# Patient Record
Sex: Female | Born: 1960
Health system: Southern US, Community
[De-identification: ages and names within clinical notes are randomized; demographics above are authoritative.]

## PROBLEM LIST (undated history)

## (undated) DIAGNOSIS — J45909 Unspecified asthma, uncomplicated: Secondary | ICD-10-CM

## (undated) DIAGNOSIS — K635 Polyp of colon: Secondary | ICD-10-CM

## (undated) DIAGNOSIS — E119 Type 2 diabetes mellitus without complications: Secondary | ICD-10-CM

## (undated) DIAGNOSIS — Z860100 Personal history of colon polyps, unspecified: Secondary | ICD-10-CM

## (undated) HISTORY — PX: ABDOMINAL HYSTERECTOMY: SHX81

## (undated) HISTORY — DX: Polyp of colon: K63.5

## (undated) HISTORY — PX: ANKLE FRACTURE SURGERY: SHX122

## (undated) HISTORY — DX: Personal history of colon polyps, unspecified: Z86.0100

## (undated) HISTORY — DX: Unspecified asthma, uncomplicated: J45.909

## (undated) HISTORY — DX: Personal history of colonic polyps: Z86.010

## (undated) HISTORY — DX: Type 2 diabetes mellitus without complications: E11.9

## (undated) HISTORY — PX: TUBAL LIGATION: SHX77

---

## 1998-03-11 HISTORY — PX: PARTIAL HYSTERECTOMY: SHX80

## 2015-05-03 LAB — HM COLONOSCOPY

## 2016-02-06 DIAGNOSIS — D649 Anemia, unspecified: Secondary | ICD-10-CM | POA: Insufficient documentation

## 2018-05-11 ENCOUNTER — Ambulatory Visit (INDEPENDENT_AMBULATORY_CARE_PROVIDER_SITE_OTHER): Payer: BC Managed Care – PPO | Admitting: Physician Assistant

## 2018-05-11 ENCOUNTER — Encounter: Payer: Self-pay | Admitting: Physician Assistant

## 2018-05-11 VITALS — BP 125/84 | HR 76 | Temp 98.0°F | Resp 16 | Ht 64.0 in | Wt 211.0 lb

## 2018-05-11 DIAGNOSIS — D509 Iron deficiency anemia, unspecified: Secondary | ICD-10-CM | POA: Diagnosis not present

## 2018-05-11 DIAGNOSIS — Z1231 Encounter for screening mammogram for malignant neoplasm of breast: Secondary | ICD-10-CM

## 2018-05-11 DIAGNOSIS — K5904 Chronic idiopathic constipation: Secondary | ICD-10-CM | POA: Insufficient documentation

## 2018-05-11 DIAGNOSIS — Z90710 Acquired absence of both cervix and uterus: Secondary | ICD-10-CM | POA: Insufficient documentation

## 2018-05-11 DIAGNOSIS — Z860100 Personal history of colon polyps, unspecified: Secondary | ICD-10-CM | POA: Insufficient documentation

## 2018-05-11 DIAGNOSIS — J4541 Moderate persistent asthma with (acute) exacerbation: Secondary | ICD-10-CM | POA: Diagnosis not present

## 2018-05-11 DIAGNOSIS — G4440 Drug-induced headache, not elsewhere classified, not intractable: Secondary | ICD-10-CM

## 2018-05-11 DIAGNOSIS — K5909 Other constipation: Secondary | ICD-10-CM

## 2018-05-11 DIAGNOSIS — Z7689 Persons encountering health services in other specified circumstances: Secondary | ICD-10-CM

## 2018-05-11 DIAGNOSIS — G43109 Migraine with aura, not intractable, without status migrainosus: Secondary | ICD-10-CM | POA: Insufficient documentation

## 2018-05-11 DIAGNOSIS — G43119 Migraine with aura, intractable, without status migrainosus: Secondary | ICD-10-CM | POA: Diagnosis not present

## 2018-05-11 MED ORDER — PREDNISONE 50 MG PO TABS: ORAL_TABLET | ORAL | 0 refills | Status: DC

## 2018-05-11 MED ORDER — ERENUMAB-AOOE 70 MG/ML ~~LOC~~ SOAJ: 70.0000 mg | SUBCUTANEOUS | 3 refills | Status: DC

## 2018-05-11 MED ORDER — RIZATRIPTAN BENZOATE 10 MG PO TBDP: 10.0000 mg | ORAL_TABLET | ORAL | 3 refills | Status: DC | PRN

## 2018-05-11 NOTE — Patient Instructions (Addendum)
For your migraines - STOP Tylenol and OTC pain relievers. Limit use of these medications to no more than 2-3 days per week to prevent Medication Overuse Headache - headache diary (paper or app like MigraineBuddy, iHeadache) - cont Topamax - start Aimovig injections *Please allow 1-2 weeks for prior authorization with your insurance - optional B2 (riboflavin) and Magnesiumsupplements - take Rizatriptan at the first sign of migraine for abortive therapy - may repeat once after 2 hours if headache persists - do not use more than twice in 24 hours. And try not to use abortive medication more than 6 times per month - work on sleep hygiene - drink 11 cups of water per day - reduce caffeine intake - avoid alcohol - don't skip meals    Recurrent Migraine Headache  Migraines are a type of headache, and they are usually stronger and more sudden than normal headaches (tension headaches). Migraines are characterized by an intense pulsing, throbbing pain that is usually only present on one side of the head. Sometimes, migraine headaches can cause nausea, vomiting, sensitivity to light and sound, and vision changes. Recurrent migraines keep coming back (recurring). A migraine can last from 4 hours up to 3 days. What are the causes? The exact cause of this condition is not known. However, a migraine may be caused when nerves in the brain become irritated and release chemicals that cause inflammation of blood vessels. This inflammation causes pain. Certain things may also trigger migraines, such as:  A disruption in your regular eating and sleeping schedule.  Smoking.  Stress.  Menstruation.  Certain foods and drinks, such as: ? Aged cheese. ? Chocolate. ? Alcohol. ? Caffeine. ? Foods or drinks that contain nitrates, glutamate, aspartame, MSG, or tyramine.  Lack of sleep.  Hunger.  Physical exertion.  Fatigue.  High altitude.  Weather changes.  Medicines, such as: ? Nitroglycerin,  which is used to treat chest pain. ? Birth control pills. ? Estrogen. ? Some blood pressure medicines. What are the signs or symptoms? Symptoms of this condition vary for each person and may include:  Pain that is usually only present on one side of the head. In some cases, the pain may be on both sides of the head or around the head or neck.  Pulsating or throbbing pain.  Severe pain that prevents daily activities.  Pain that is aggravated by any physical activity.  Nausea, vomiting, or both.  Dizziness.  Pain with exposure to bright lights, loud noises, or activity.  General sensitivity to bright lights, loud noises, or smells. Before you get a migraine, you may get warning signs that a migraine is coming (aura). An aura may include:  Seeing flashing lights.  Seeing bright spots, halos, or zigzag lines.  Having tunnel vision or blurred vision.  Having numbness or a tingling feeling.  Having trouble talking.  Having muscle weakness.  Smelling a certain odor. How is this diagnosed? This condition is often diagnosed based on:  Your symptoms and medical history.  A physical exam. You may also have tests, including:  A CT scan or MRI of your brain. These imaging tests cannot diagnose migraines, but they can help to rule out other causes of headaches.  Blood tests. How is this treated? This condition is treated with:  Medicines. These are used for: ? Lessening pain and nausea. ? Preventing recurrent migraines.  Lifestyle changes, such as changes to your diet or sleeping patterns.  Behavior therapy, such as relaxation training or biofeedback. Biofeedback is  a treatment that involves teaching you to relax and use your brain to lower your heart rate and control your breathing. Follow these instructions at home: Medicines  Take over-the-counter and prescription medicines only as told by your health care provider.  Do not drive or use heavy machinery while taking  prescription pain medicine. Lifestyle  Do not use any products that contain nicotine or tobacco, such as cigarettes and e-cigarettes. If you need help quitting, ask your health care provider.  Limit alcohol intake to no more than 1 drink a day for nonpregnant women and 2 drinks a day for men. One drink equals 12 oz of beer, 5 oz of wine, or 1 oz of hard liquor.  Get 7-9 hours of sleep each night, or the amount of sleep recommended by your health care provider.  Limit your stress. Talk with your health care provider if you need help with stress management.  Maintain a healthy weight. If you need help losing weight, ask your health care provider.  Exercise regularly. Aim for 150 minutes of moderate-intensity exercise (walking, biking, yoga) or 75 minutes of vigorous exercise (running, circuit training, swimming) each week. General instructions   Keep a journal to find out what triggers your migraine headaches so you can avoid these triggers. For example, write down: ? What you eat and drink. ? How much sleep you get. ? Any change to your diet or medicines.  Lie down in a dark, quiet room when you have a migraine.  Try placing a cool towel over your head when you have a migraine.  Keep lights dim, if bright lights bother you and make your migraines worse.  Keep all follow-up visits as told by your health care provider. This is important. Contact a health care provider if:  Your pain does not improve, even with medicine.  Your migraines continue to return, even with medicine.  You have a fever.  You have weight loss. Get help right away if:  Your migraine becomes severe and medicine does not help.  You have a stiff neck.  You have a loss of vision.  You have muscle weakness or loss of muscle control.  You start losing your balance or have trouble walking.  You feel faint or you pass out.  You develop new, severe symptoms.  You start having abrupt severe headaches  that last for a second or less, like a thunderclap. Summary  Migraine headaches are usually stronger and more sudden than normal headaches (tension headaches). Migraines are characterized by an intense pulsing, throbbing pain that is usually only present on one side of the head.  The exact cause of this condition is not known. However, a migraine may be caused when nerves in the brain become irritated and release chemicals that cause inflammation of blood vessels.  Certain things may trigger migraines, such as changes to diet or sleeping patterns, smoking, certain foods, alcohol, stress, and certain medicines.  Sometimes, migraine headaches can cause nausea, vomiting, sensitivity to light and sound, and vision changes.  Migraines are often diagnosed based on your symptoms, medical history, and a physical exam. This information is not intended to replace advice given to you by your health care provider. Make sure you discuss any questions you have with your health care provider. Document Released: 11/20/2000 Document Revised: 12/08/2015 Document Reviewed: 12/08/2015 Elsevier Interactive Patient Education  2019 [employer].

## 2018-05-11 NOTE — Progress Notes (Signed)
HPI:                                                                Karen Love is a 58 y.o. female who presents to [facility] today to establish care  Hx of colon polyps: patient reports polyps were benign. Last colonoscopy was 2016 or 2017 with Dr. Dorene Grebe in [place], [place]. States she was told to follow-up in 3 years. No family hx of colon cancer.  Chronic constipation: Reports she has issues with her stomach. She endorses constipation for which she used to take Linzess. Also states every time she eats she has gurgling sounds and occasional left upper quadrant pain.   Asthma: currently takes Advair. Reports triggers are viral infections and humid weather.  No hospitalizations in the last year. She had 2 exacerbations this year and was treated with steroids in urgent care. History of pneumonia. Has never had a pneumonia vaccine Reports she has had cold symptoms for about 1 week. Endorses nocturnal cough waking her from sleep and having to use her Albuterol inhaler most nights.   Migraine: takes Topamax 100 mg at bedtime, has been taking this for about 1 year. She endorses an aura of "dots" in her visual fields. Reports she has been having a daily morning headache for the last 3 weeks.  She has been taking Tylenol and Tylenol pm almost daily. She takes Rizatriptan as needed for abortive but has been out of this for the last month. Prior preventive medication: Amitryptiline (weight gain, not effective)  Hx of partial hysterectomy: reports she had uterine fibroids and anemia. No prior abdormal Pap smears or cervical cancer.  Iron Deficiency Anemia: followed by Hematology.  At last office visit in March 2019 hematologist recommended a bone marrow biopsy to further evaluate her persistent anemia since she is status post hysterectomy and had a colonoscopy.  According to the physician's note "she would like to hold having the bone marrow done for a  while and see if with more iron her hemoglobin improves".  Recommended follow-up was 6 months, but it appears patient has been lost to follow-up.  Depression screen [facility] 2/9 05/11/2018  Decreased Interest 0  Down, Depressed, Hopeless 0  PHQ - 2 Score 0    No flowsheet data found.    Past Medical History:  Diagnosis Date  . Asthma   . Colon polyps   . Diabetes mellitus without complication ([place])   . History of colon polyps    Past Surgical History:  Procedure Laterality Date  . ABDOMINAL HYSTERECTOMY    . ANKLE FRACTURE SURGERY Right   . CESAREAN SECTION    . PARTIAL HYSTERECTOMY  2000  . TUBAL LIGATION     Social History   Tobacco Use  . Smoking status: Former Smoker    Packs/day: 0.25    Years: 20.00    Pack years: 5.00    Last attempt to quit: 05/11/2011    Years since quitting: 7.0  . Smokeless tobacco: Never Used  Substance Use Topics  . Alcohol use: Not Currently   family history is not on file.    Review of Systems  Constitutional: Positive for unexpected weight change (weight gain).  Respiratory: Positive for wheezing (asthma).  Gastrointestinal: Positive for abdominal distention and abdominal pain (LUQ).  Endocrine:       + weight gain  Neurological: Positive for headaches.  All other systems reviewed and are negative.    Medications: Current Outpatient Medications  Medication Sig Dispense Refill  . albuterol (PROVENTIL HFA;VENTOLIN HFA) 108 (90 Base) MCG/ACT inhaler Inhale 1 puff into the lungs every 4 (four) hours as needed.    . Fluticasone-Salmeterol (ADVAIR) 500-50 MCG/DOSE AEPB Inhale 1 puff into the lungs 2 (two) times daily.    . meloxicam (MOBIC) 15 MG tablet Take 1 tablet by mouth daily.    Marland Kitchen topiramate (TOPAMAX) 100 MG tablet Take 100 mg by mouth at bedtime.    Eduard Roux (AIMOVIG) 70 MG/ML SOAJ Inject 70 mg into the skin every 30 (thirty) days. 1 pen 3  . predniSONE (DELTASONE) 50 MG tablet One tab PO daily for 5 days. 5 tablet 0  .  rizatriptan (MAXALT-MLT) 10 MG disintegrating tablet Take 1 tablet (10 mg total) by mouth as needed for migraine. May repeat in 2 hours if needed 9 tablet 3   No current facility-administered medications for this visit.    Allergies  Allergen Reactions  . Azithromycin Nausea And Vomiting  . Tramadol Nausea And Vomiting            Objective:  BP 125/84   Pulse 76   Temp 98 F (36.7 C)   Resp 16   Ht '5\' 4"'  (1.626 m)   Wt 211 lb (95.7 kg)   SpO2 98%   BMI 36.22 kg/m  Gen: well-groomed, not ill-appearing, no acute distress, obese female HEENT: head normocephalic, atraumatic; conjunctiva and cornea clear, oropharynx clear, moist mucus membranes; neck supple, no meningeal signs Pulm: Normal work of breathing, normal phonation, clear to auscultation bilaterally CV: Normal rate, regular rhythm, s1 and s2 distinct, no murmurs, clicks or rubs Neuro:  cranial nerves II-XII intact, no nystagmus, normal finger-to-nose, normal heel-to-shin, negative pronator drift, normal rapid alternating movements, DTR's intact, normal tone, no tremor MSK: strength 5/5 and symmetric in bilateral upper and lower extremities, normal gait and station, negative Romberg Mental Status: alert and oriented x 3, speech articulate, and thought processes clear and goal-directed    No results found for this or any previous visit (from the past 72 hour(s)). No results found.    Assessment and Plan: 58 y.o. female with   .[place] was seen today for establish care.  Diagnoses and all orders for this visit:  Encounter to establish care  Hx of hysterectomy with hx of benign disease or negative Pap smears  History of colon polyps -     Ambulatory referral to Gastroenterology  Breast cancer screening by mammogram -     MM 3D SCREEN BREAST BILATERAL; Future  Chronic constipation  Intractable migraine with aura without status migrainosus -     Erenumab-aooe (AIMOVIG) 70 MG/ML SOAJ; Inject 70 mg into the  skin every 30 (thirty) days. -     rizatriptan (MAXALT-MLT) 10 MG disintegrating tablet; Take 1 tablet (10 mg total) by mouth as needed for migraine. May repeat in 2 hours if needed  Moderate persistent asthma with acute exacerbation -     predniSONE (DELTASONE) 50 MG tablet; One tab PO daily for 5 days.  Medication overuse headache  Iron deficiency anemia, unspecified iron deficiency anemia type -     Ambulatory referral to Hematology   - Personally reviewed PMH, PSH, PFH, medications, allergies, HM - Age-appropriate cancer screening: Colonoscopy up-to-date,  requesting outside records: No longer a candidate for Pap smears: Mammogram overdue, order placed today - Influenza up-to-date per patient - Tdap up-to-date per patient - PHQ2 negative  Mild asthma exacerbation Afebrile, no tachypnea, no tachycardia, pulse ox 98% on room air at rest, no adventitious lung sounds Patient reports increased nocturnal cough and increased use of rescue inhaler in the evening for the last week following a upper respiratory infection Prednisone burst 50 mg daily for 5 days Follow-up as needed Will need Pneumovax once symptoms have resolved  Migraine with aura, medication overuse headache Patient with longstanding history of migraines presents with several weeks of a daily persistent headache in the setting of daily use of over-the-counter Tylenol and pain relievers Continue Topamax 100 mg nightly, adding Aimovig 70 mg monthly Counseled on measures to prevent medication overuse headache including limiting triptan's to 6 days/month and limiting over-the-counter analgesics to 2 days/week Prednisone burst for [facility]  IDA Overdue for follow-up May need bone marrow bx according to previous hematologist (Dr. Nelva Nay in [place], [place]) Referral placed to hematology to establish care Will obtain labs at her next office visit in 2 weeks  Patient education and anticipatory guidance given Patient agrees with  treatment plan Follow-up in 2 weeks for weight management as needed if symptoms worsen or fail to improve  Darlyne Russian PA-C

## 2018-05-12 ENCOUNTER — Telehealth: Payer: Self-pay | Admitting: Physician Assistant

## 2018-05-12 NOTE — Telephone Encounter (Signed)
-----   Message from Neldon Labella, CMA sent at 05/12/2018  9:23 AM EST ----- Regarding: FW: Aimovig PA Received fax from [employer] that Aimovig requires a PA. Information has been sent to the insurance company. Awaiting determination.  ----- Message ----- From: Carlis Stable, PA-C Sent: 05/11/2018   3:40 PM EST To: Neldon Labella, CMA Subject: Aimovig PA                                     Has failed Topamax and Amitryptiline >4 headache days/month

## 2018-05-13 ENCOUNTER — Encounter: Payer: Self-pay | Admitting: Physician Assistant

## 2018-05-13 DIAGNOSIS — G4440 Drug-induced headache, not elsewhere classified, not intractable: Secondary | ICD-10-CM | POA: Insufficient documentation

## 2018-05-21 ENCOUNTER — Telehealth: Payer: Self-pay | Admitting: Family

## 2018-05-21 NOTE — Telephone Encounter (Signed)
lvm for pt to return call to office re new patient appt. Mailed appt letter for 3/31 at 1:30 pm

## 2018-05-25 ENCOUNTER — Ambulatory Visit: Payer: BLUE CROSS/BLUE SHIELD | Admitting: Physician Assistant

## 2018-05-26 NOTE — Telephone Encounter (Signed)
Message from Plan (Aimovig) Effective from 05/12/2018 through 08/09/2018.  Pharmacy aware.

## 2018-06-05 ENCOUNTER — Ambulatory Visit: Payer: BLUE CROSS/BLUE SHIELD | Admitting: Physician Assistant

## 2018-06-08 ENCOUNTER — Other Ambulatory Visit: Payer: Self-pay | Admitting: Family

## 2018-06-08 DIAGNOSIS — D649 Anemia, unspecified: Secondary | ICD-10-CM

## 2018-06-09 ENCOUNTER — Inpatient Hospital Stay: Payer: BLUE CROSS/BLUE SHIELD

## 2018-06-09 ENCOUNTER — Inpatient Hospital Stay: Payer: BLUE CROSS/BLUE SHIELD | Attending: Family | Admitting: Family

## 2018-06-24 ENCOUNTER — Telehealth: Payer: Self-pay | Admitting: Physician Assistant

## 2018-06-24 NOTE — Telephone Encounter (Signed)
I called and spoke to Karen Love regarding her No Show to her Hematology appt Patient stated she was afraid to go to any appointments due to COVID-19. She also questioned the necessity of this referral We had a discussion and I explained that her last hematologist was concerned about her persistent anemia despite hysterectomy and normal colonoscopy. Explained that hematologist had suggested a bone marrow biopsy. Patient was amenable to rescheduling her appt with Hematology I provided her with contact info for NP [place]   Additionally patient reported she has been having daily migraine headaches for the last 10 days and Maxalt is not working. I instructed her to call our office and schedule an e-visit to discuss migraines with me.

## 2018-06-26 ENCOUNTER — Ambulatory Visit (INDEPENDENT_AMBULATORY_CARE_PROVIDER_SITE_OTHER): Payer: BLUE CROSS/BLUE SHIELD | Admitting: Physician Assistant

## 2018-06-26 ENCOUNTER — Encounter: Payer: Self-pay | Admitting: Physician Assistant

## 2018-06-26 DIAGNOSIS — D509 Iron deficiency anemia, unspecified: Secondary | ICD-10-CM | POA: Diagnosis not present

## 2018-06-26 DIAGNOSIS — K5904 Chronic idiopathic constipation: Secondary | ICD-10-CM

## 2018-06-26 DIAGNOSIS — G43119 Migraine with aura, intractable, without status migrainosus: Secondary | ICD-10-CM

## 2018-06-26 DIAGNOSIS — R252 Cramp and spasm: Secondary | ICD-10-CM | POA: Insufficient documentation

## 2018-06-26 MED ORDER — ERENUMAB-AOOE 70 MG/ML ~~LOC~~ SOAJ: 70.0000 mg | SUBCUTANEOUS | 3 refills | Status: DC

## 2018-06-26 MED ORDER — LINACLOTIDE 72 MCG PO CAPS: 72.0000 ug | ORAL_CAPSULE | Freq: Every day | ORAL | 1 refills | Status: DC

## 2018-06-26 MED ORDER — DIPHENHYDRAMINE HCL 25 MG PO TABS: 25.0000 mg | ORAL_TABLET | Freq: Every evening | ORAL | 1 refills | Status: AC | PRN

## 2018-06-26 MED ORDER — ELETRIPTAN HYDROBROMIDE 40 MG PO TABS: 40.0000 mg | ORAL_TABLET | Freq: Once | ORAL | 0 refills | Status: DC | PRN

## 2018-06-26 MED ORDER — FERROUS FUMARATE 324 (106 FE) MG PO TABS: 1.0000 | ORAL_TABLET | Freq: Two times a day (BID) | ORAL | 0 refills | Status: DC

## 2018-06-26 MED ORDER — TOPIRAMATE 100 MG PO TABS: 100.0000 mg | ORAL_TABLET | Freq: Every day | ORAL | 0 refills | Status: DC

## 2018-06-26 MED ORDER — B COMPLEX VITAMINS PO CAPS: 1.0000 | ORAL_CAPSULE | Freq: Three times a day (TID) | ORAL | 1 refills | Status: DC

## 2018-06-26 NOTE — Progress Notes (Signed)
Virtual Visit via Video Note  I connected with Karen Love on 07/02/18 at  4:20 PM EDT by a video enabled telemedicine application and verified that I am speaking with the correct person using two identifiers.   I discussed the limitations of evaluation and management by telemedicine and the availability of in person appointments. The patient expressed understanding and agreed to proceed.  History of Present Illness: HPI:                                                                Karen Love is a 58 y.o. female   CC: migraines  Migraine:  For the last 1.5-2 weeks has been having almost daily headaches Headaches feel like her typical migraine, located temporal or occipital areas Associated with dizziness and nausea No paresthesias, weakness or speech difficulty She reports she self-discontinued her Topamax when she started Aimovig and she has only had 1 Aimovig injection to date and is overdue for her second injection She endorses an aura of "dots" in her visual fields. Reports she has been having a daily morning headache for the last 3 weeks.  Reports Rizatriptan has not been effecitve in aborting her migraines.  Also states she has been having daily and nightly leg cramps involving the entire leg, alternating bilaterally  Also requesting refill of Linzess for chronic constipation. Denies abdominal pain, appetite change, melena/hematochezia. She is on Q3y colon cancer screening schedule for hx of colon polyps  Past Medical History:  Diagnosis Date  . Asthma   . Colon polyps   . Diabetes mellitus without complication ([facility])   . History of colon polyps    Past Surgical History:  Procedure Laterality Date  . ABDOMINAL HYSTERECTOMY    . ANKLE FRACTURE SURGERY Right   . CESAREAN SECTION    . PARTIAL HYSTERECTOMY  2000  . TUBAL LIGATION     Social History   Tobacco Use  . Smoking status: Former Smoker    Packs/day: 0.25    Years: 20.00    Pack years: 5.00    Last  attempt to quit: 05/11/2011    Years since quitting: 7.1  . Smokeless tobacco: Never Used  Substance Use Topics  . Alcohol use: Not Currently   family history is not on file.    ROS: negative except as noted in the HPI  Medications: Current Outpatient Medications  Medication Sig Dispense Refill  . albuterol (PROVENTIL HFA;VENTOLIN HFA) 108 (90 Base) MCG/ACT inhaler Inhale 1 puff into the lungs every 4 (four) hours as needed.    Dorise Hiss (AIMOVIG) 70 MG/ML SOAJ Inject 70 mg into the skin every 30 (thirty) days. 1 pen 3  . Fluticasone-Salmeterol (ADVAIR) 500-50 MCG/DOSE AEPB Inhale 1 puff into the lungs 2 (two) times daily.    . meloxicam (MOBIC) 15 MG tablet Take 1 tablet by mouth daily.    Marland Kitchen b complex vitamins capsule Take 1 capsule by mouth 3 (three) times daily. 270 capsule 1  . diphenhydrAMINE (BENADRYL) 25 MG tablet Take 1 tablet (25 mg total) by mouth at bedtime as needed (leg cramps). 90 tablet 1  . eletriptan (RELPAX) 40 MG tablet Take 1 tablet (40 mg total) by mouth once as needed for up to 1 dose for migraine. May repeat in 2  hours if headache persists or recurs. Limit 2 tabs/24 hours 6 tablet 0  . Ferrous Fumarate (HEMOCYTE - 106 MG FE) 324 (106 Fe) MG TABS tablet Take 1 tablet (106 mg of iron total) by mouth 2 (two) times daily. 180 tablet 0  . linaclotide (LINZESS) 290 MCG CAPS capsule Take 1 capsule (290 mcg total) by mouth daily before breakfast. 90 capsule 0  . topiramate (TOPAMAX) 100 MG tablet Take 1 tablet (100 mg total) by mouth at bedtime. 90 tablet 0   No current facility-administered medications for this visit.    Allergies  Allergen Reactions  . Amitriptyline Hcl Other (See Comments)    Weight gain, ineffective for migraines  . Azithromycin Nausea And Vomiting  . Tramadol Nausea And Vomiting            Objective:  There were no vitals taken for this visit. Gen:  alert, not ill-appearing, no distress, appropriate for age HEENT: head normocephalic  without obvious abnormality, conjunctiva and cornea clear, trachea midline Pulm: Normal work of breathing, normal phonation Neuro: alert and oriented x 3 Psych: cooperative, euthymic mood, affect mood-congruent, speech is articulate, normal rate and volume; thought processes clear and goal-directed, normal judgment, good insight   No results found for this or any previous visit (from the past 72 hour(s)). No results found.    Assessment and Plan: 58 y.o. female with   .Karen Love was seen today for medication management.  Diagnoses and all orders for this visit:  Intractable migraine with aura without status migrainosus -     Erenumab-aooe (AIMOVIG) 70 MG/ML SOAJ; Inject 70 mg into the skin every 30 (thirty) days. -     eletriptan (RELPAX) 40 MG tablet; Take 1 tablet (40 mg total) by mouth once as needed for up to 1 dose for migraine. May repeat in 2 hours if headache persists or recurs. Limit 2 tabs/24 hours -     topiramate (TOPAMAX) 100 MG tablet; Take 1 tablet (100 mg total) by mouth at bedtime.  Bilateral leg cramps -     b complex vitamins capsule; Take 1 capsule by mouth 3 (three) times daily. -     diphenhydrAMINE (BENADRYL) 25 MG tablet; Take 1 tablet (25 mg total) by mouth at bedtime as needed (leg cramps).  Chronic idiopathic constipation -     Discontinue: linaclotide (LINZESS) 72 MCG capsule; Take 1 capsule (72 mcg total) by mouth daily before breakfast.  Iron deficiency anemia, unspecified iron deficiency anemia type -     Ferrous Fumarate (HEMOCYTE - 106 MG FE) 324 (106 Fe) MG TABS tablet; Take 1 tablet (106 mg of iron total) by mouth 2 (two) times daily.   Migraine - increased headache frequency likely due to sudden discontinuation of Topamax and subtherapeutic dosing of Aimovig. IDA may also be contributory (ischemia). Re-start Topamax 100 mg QHS. Cont Aimovig monthly and reassess in 3 months Switching from Maxalt to Relpax for abortive therapy Start iron  supplementation bid every other day  Follow-up in 3 months or sooner as needed  Follow Up Instructions:    I discussed the assessment and treatment plan with the patient. The patient was provided an opportunity to ask questions and all were answered. The patient agreed with the plan and demonstrated an understanding of the instructions.   The patient was advised to call back or seek an in-person evaluation if the symptoms worsen or if the condition fails to improve as anticipated.  I provided 25 minutes of non-face-to-face time during  this encounter.   Carlis Stableharley Elizabeth Daivik Overley, [place] JerseyPA-C

## 2018-06-28 ENCOUNTER — Other Ambulatory Visit: Payer: Self-pay | Admitting: Physician Assistant

## 2018-06-28 DIAGNOSIS — K5904 Chronic idiopathic constipation: Secondary | ICD-10-CM

## 2018-07-02 ENCOUNTER — Telehealth: Payer: Self-pay

## 2018-07-02 DIAGNOSIS — K5904 Chronic idiopathic constipation: Secondary | ICD-10-CM

## 2018-07-02 MED ORDER — PLECANATIDE 3 MG PO TABS: 3.0000 mg | ORAL_TABLET | Freq: Every day | ORAL | 0 refills | Status: DC

## 2018-07-02 NOTE — Telephone Encounter (Signed)
I have not seen anything about this and I was starting the process and the preferred is Trulance. I do not see where the patient has taken this in the past. Please advise.

## 2018-07-02 NOTE — Telephone Encounter (Signed)
Pt is requesting status of PA. Please advise. -EH/RMA

## 2018-07-02 NOTE — Telephone Encounter (Signed)
Karen Love, I sent in Trulance Please contact patient and update her as to why Linzess was changed to Trulance She can download a savings card online [email or web address]

## 2018-07-03 NOTE — Telephone Encounter (Signed)
Approvedon April 23 Linzess Effective from 07/02/2018 through 07/01/2019.  Also Received Trulance approval.  Approvedon April 23 Effective from 07/02/2018 through 07/01/2019. Pharmacy aware and patient is aware.

## 2018-07-08 ENCOUNTER — Encounter: Payer: Self-pay | Admitting: Physician Assistant

## 2018-07-08 ENCOUNTER — Ambulatory Visit (INDEPENDENT_AMBULATORY_CARE_PROVIDER_SITE_OTHER): Payer: BLUE CROSS/BLUE SHIELD | Admitting: Physician Assistant

## 2018-07-08 VITALS — Temp 97.2°F | Ht 64.5 in | Wt 213.0 lb

## 2018-07-08 DIAGNOSIS — G43109 Migraine with aura, not intractable, without status migrainosus: Secondary | ICD-10-CM

## 2018-07-08 NOTE — Progress Notes (Signed)
Virtual Visit via Video Note  I connected with Karen Love on 07/08/18 at 10:50 AM EDT by telemedicine application and verified that I am speaking with the correct person using two identifiers.   I discussed the limitations of evaluation and management by telemedicine and the availability of in person appointments. The patient expressed understanding and agreed to proceed.  History of Present Illness: HPI:                                                                Karen Love is a 58 y.o. female   CC: migraine f/u, work note  Reports she missed work on Monday due to a migraine. She reported to work on Tuesday and still had a migraine. States she was too photosensitive and headache got worse, so she went home. Her employer was concerned that she may have corona virus and told her to schedule this appointment in order to return to work.   Past Medical History:  Diagnosis Date  . Asthma   . Colon polyps   . Diabetes mellitus without complication (HCC)   . History of colon polyps    Past Surgical History:  Procedure Laterality Date  . ABDOMINAL HYSTERECTOMY    . ANKLE FRACTURE SURGERY Right   . CESAREAN SECTION    . PARTIAL HYSTERECTOMY  2000  . TUBAL LIGATION     Social History   Tobacco Use  . Smoking status: Former Smoker    Packs/day: 0.25    Years: 20.00    Pack years: 5.00    Last attempt to quit: 05/11/2011    Years since quitting: 7.1  . Smokeless tobacco: Never Used  Substance Use Topics  . Alcohol use: Not Currently   family history is not on file.    ROS: negative except as noted in the HPI  Medications: Current Outpatient Medications  Medication Sig Dispense Refill  . albuterol (PROVENTIL HFA;VENTOLIN HFA) 108 (90 Base) MCG/ACT inhaler Inhale 1 puff into the lungs every 4 (four) hours as needed.    Marland Kitchen. b complex vitamins capsule Take 1 capsule by mouth 3 (three) times daily. 270 capsule 1  . diphenhydrAMINE (BENADRYL) 25 MG tablet Take 1 tablet (25 mg  total) by mouth at bedtime as needed (leg cramps). 90 tablet 1  . eletriptan (RELPAX) 40 MG tablet Take 1 tablet (40 mg total) by mouth once as needed for up to 1 dose for migraine. May repeat in 2 hours if headache persists or recurs. Limit 2 tabs/24 hours 6 tablet 0  . Erenumab-aooe (AIMOVIG) 70 MG/ML SOAJ Inject 70 mg into the skin every 30 (thirty) days. 1 pen 3  . Ferrous Fumarate (HEMOCYTE - 106 MG FE) 324 (106 Fe) MG TABS tablet Take 1 tablet (106 mg of iron total) by mouth 2 (two) times daily. 180 tablet 0  . Fluticasone-Salmeterol (ADVAIR) 500-50 MCG/DOSE AEPB Inhale 1 puff into the lungs 2 (two) times daily.    . meloxicam (MOBIC) 15 MG tablet Take 1 tablet by mouth daily.    Marland Kitchen. topiramate (TOPAMAX) 100 MG tablet Take 1 tablet (100 mg total) by mouth at bedtime. 90 tablet 0  . Plecanatide (TRULANCE) 3 MG TABS Take 3 mg by mouth daily. (Patient not taking: Reported on 07/08/2018) 90 tablet  0   No current facility-administered medications for this visit.    Allergies  Allergen Reactions  . Amitriptyline Hcl Other (See Comments)    Weight gain, ineffective for migraines  . Azithromycin Nausea And Vomiting  . Tramadol Nausea And Vomiting            Objective:  Temp (!) 97.2 F (36.2 C) (Oral)   Ht 5' 4.5" (1.638 m)   Wt 213 lb (96.6 kg)   BMI 36.00 kg/m     Assessment and Plan: 58 y.o. female with   .Karen Love was seen today for migraine.  Diagnoses and all orders for this visit:  Migraine with aura and without status migrainosus, not intractable   Work note provided to return to work today. Also recommended she contact her HR department regarding intermittent FMLA   Follow Up Instructions:    I discussed the assessment and treatment plan with the patient. The patient was provided an opportunity to ask questions and all were answered. The patient agreed with the plan and demonstrated an understanding of the instructions.   The patient was advised to call back or  seek an in-person evaluation if the symptoms worsen or if the condition fails to improve as anticipated.  I provided 5-10 minutes of non-face-to-face time during this encounter.   Carlis Stable, [place]

## 2018-07-16 ENCOUNTER — Encounter: Payer: Self-pay | Admitting: Physician Assistant

## 2018-07-23 ENCOUNTER — Other Ambulatory Visit: Payer: Self-pay | Admitting: Physician Assistant

## 2018-07-23 DIAGNOSIS — G43119 Migraine with aura, intractable, without status migrainosus: Secondary | ICD-10-CM

## 2018-08-05 ENCOUNTER — Telehealth: Payer: Self-pay

## 2018-08-05 NOTE — Telephone Encounter (Signed)
Information has been sent to insurance and waiting on a response.   

## 2018-08-05 NOTE — Telephone Encounter (Signed)
PA needed for Aimovig  Key: [id number]

## 2018-08-05 NOTE — Telephone Encounter (Signed)
Message from Plan (Aimovig) Effective from 08/05/2018 through 11/02/2018. Pharmacy aware.

## 2018-08-12 ENCOUNTER — Telehealth: Payer: Self-pay | Admitting: Family

## 2018-08-12 NOTE — Telephone Encounter (Signed)
LMVM for patient to call back to rescheduled her missed appt / IB message also sent to referring MD

## 2018-09-03 ENCOUNTER — Encounter: Payer: Self-pay | Admitting: Physician Assistant

## 2018-09-20 ENCOUNTER — Other Ambulatory Visit: Payer: Self-pay | Admitting: Physician Assistant

## 2018-09-20 DIAGNOSIS — D509 Iron deficiency anemia, unspecified: Secondary | ICD-10-CM

## 2018-09-21 ENCOUNTER — Other Ambulatory Visit: Payer: Self-pay | Admitting: Physician Assistant

## 2018-09-21 DIAGNOSIS — G43119 Migraine with aura, intractable, without status migrainosus: Secondary | ICD-10-CM

## 2018-09-24 ENCOUNTER — Ambulatory Visit (INDEPENDENT_AMBULATORY_CARE_PROVIDER_SITE_OTHER): Payer: BC Managed Care – PPO | Admitting: Physician Assistant

## 2018-09-24 ENCOUNTER — Encounter: Payer: Self-pay | Admitting: Physician Assistant

## 2018-09-24 ENCOUNTER — Ambulatory Visit (INDEPENDENT_AMBULATORY_CARE_PROVIDER_SITE_OTHER): Payer: BC Managed Care – PPO

## 2018-09-24 ENCOUNTER — Other Ambulatory Visit: Payer: Self-pay

## 2018-09-24 VITALS — BP 119/80 | HR 72 | Temp 98.3°F | Wt 212.0 lb

## 2018-09-24 DIAGNOSIS — R1032 Left lower quadrant pain: Secondary | ICD-10-CM | POA: Diagnosis not present

## 2018-09-24 DIAGNOSIS — R0781 Pleurodynia: Secondary | ICD-10-CM

## 2018-09-24 DIAGNOSIS — R195 Other fecal abnormalities: Secondary | ICD-10-CM | POA: Diagnosis not present

## 2018-09-24 DIAGNOSIS — G43119 Migraine with aura, intractable, without status migrainosus: Secondary | ICD-10-CM

## 2018-09-24 DIAGNOSIS — K648 Other hemorrhoids: Secondary | ICD-10-CM

## 2018-09-24 DIAGNOSIS — K31A0 Gastric intestinal metaplasia, unspecified: Secondary | ICD-10-CM

## 2018-09-24 DIAGNOSIS — R0789 Other chest pain: Secondary | ICD-10-CM

## 2018-09-24 DIAGNOSIS — J9811 Atelectasis: Secondary | ICD-10-CM | POA: Diagnosis not present

## 2018-09-24 DIAGNOSIS — J4540 Moderate persistent asthma, uncomplicated: Secondary | ICD-10-CM

## 2018-09-24 DIAGNOSIS — K5790 Diverticulosis of intestine, part unspecified, without perforation or abscess without bleeding: Secondary | ICD-10-CM

## 2018-09-24 DIAGNOSIS — K3189 Other diseases of stomach and duodenum: Secondary | ICD-10-CM

## 2018-09-24 DIAGNOSIS — K635 Polyp of colon: Secondary | ICD-10-CM

## 2018-09-24 DIAGNOSIS — K5904 Chronic idiopathic constipation: Secondary | ICD-10-CM

## 2018-09-24 MED ORDER — LINACLOTIDE 145 MCG PO CAPS: 145.0000 ug | ORAL_CAPSULE | Freq: Every day | ORAL | 1 refills | Status: AC

## 2018-09-24 MED ORDER — MONTELUKAST SODIUM 10 MG PO TABS: 10.0000 mg | ORAL_TABLET | Freq: Every day | ORAL | 3 refills | Status: AC

## 2018-09-24 MED ORDER — AIMOVIG 70 MG/ML ~~LOC~~ SOAJ: 70.0000 mg | SUBCUTANEOUS | 5 refills | Status: AC

## 2018-09-24 NOTE — Patient Instructions (Addendum)
I sent a message to our practice manager Haskell Riling(Wendy Blum [phone]) regarding your bill from March Recommend you also contact the [facility] Billing Department [phone]   Constipation, Adult Constipation is when a person has fewer bowel movements in a week than normal, has difficulty having a bowel movement, or has stools that are dry, hard, or larger than normal. Constipation may be caused by an underlying condition. It may become worse with age if a person takes certain medicines and does not take in enough fluids. Follow these instructions at home: Eating and drinking   Eat foods that have a lot of fiber, such as fresh fruits and vegetables, whole grains, and beans.  Limit foods that are high in fat, low in fiber, or overly processed, such as french fries, hamburgers, cookies, candies, and soda.  Drink enough fluid to keep your urine clear or pale yellow. General instructions  Exercise regularly or as told by your health care provider.  Go to the restroom when you have the urge to go. Do not hold it in.  Take over-the-counter and prescription medicines only as told by your health care provider. These include any fiber supplements.  Practice pelvic floor retraining exercises, such as deep breathing while relaxing the lower abdomen and pelvic floor relaxation during bowel movements.  Watch your condition for any changes.  Keep all follow-up visits as told by your health care provider. This is important. Contact a health care provider if:  You have pain that gets worse.  You have a fever.  You do not have a bowel movement after 4 days.  You vomit.  You are not hungry.  You lose weight.  You are bleeding from the anus.  You have thin, pencil-like stools. Get help right away if:  You have a fever and your symptoms suddenly get worse.  You leak stool or have blood in your stool.  Your abdomen is bloated.  You have severe pain in your abdomen.  You feel dizzy or you  faint. This information is not intended to replace advice given to you by your health care provider. Make sure you discuss any questions you have with your health care provider. Document Released: 11/24/2003 Document Revised: 02/07/2017 Document Reviewed: 08/16/2015 Elsevier Patient Education  2020 [employer]   Pleurisy  Pleurisy, also called pleuritis, is irritation and swelling (inflammation) of the linings of the lungs. The linings of the lungs are called pleura. They cover the outside of the lungs and the inside of the chest wall. There is a small amount of fluid (pleural fluid) between the pleura that allows the lungs to move in and out smoothly when you breathe. Pleurisy causes the pleura to be rough and dry and to rub together when you breathe, which is painful. In some cases, pleurisy can cause pleural fluid to build up between the pleura (pleural effusion). What are the causes? Common causes of this condition include:  A lung infection caused by bacteria or a virus.  A blood clot that travels to the lung (pulmonary embolism).  Air leaking into the pleural space (pneumothorax).  Lung cancer or a lung tumor.  A chest injury.  Diseases that can cause lung inflammation. These include rheumatoid arthritis, lupus, sickle cell disease, inflammatory bowel disease, and pancreatitis.  Heart or chest surgery.  Lung damage from inhaling asbestos.  A lung reaction to certain medicines. Sometimes the cause is unknown. What are the signs or symptoms? Chest pain is the main symptom of this condition. The pain  is usually on one side. Chest pain may start suddenly and be sharp or stabbing. It may become a constant dull ache. You may also feel pain in your back or shoulder. The pain may get worse when you cough, take deep breaths, or make sudden movements. Other symptoms may include:  Shortness of breath.  Noisy breathing (wheezing).  Cough.  Chills.  Fever. How is this diagnosed?  This condition may be diagnosed based on:  Your medical history.  Your symptoms.  A physical exam. Your health care provider will listen to your breathing with a stethoscope to check for a rough, rubbing sound (friction rub). If you have pleural effusion, your breathing sounds may be muffled.  Tests, such as: ? Blood tests to check for infections or diseases and to measure the oxygen in your blood. ? Imaging studies of your lungs. These may include a chest X-ray, ultrasound, MRI, or CT scan. ? A procedure to remove pleural fluid with a needle for testing (thoracentesis). How is this treated? Treatment for this condition depends on the cause. Pleurisy that was caused by a virus usually clears up within 2 weeks. Treatment for pleurisy may include:  NSAIDs to help relieve pain and swelling.  Antibiotic medicines, if your condition was caused by a bacterial infection.  Prescription pain or cough medicine.  Medicines to dissolve a blood clot, if your condition was caused by pulmonary embolism.  Removal of pleural fluid or air. Follow these instructions at home: Medicines  Take over-the-counter and prescription medicines only as told by your health care provider.  If you were prescribed an antibiotic, take it as told by your health care provider. Do not stop taking the antibiotic even if you start to feel better. Activity  Rest and return to your normal activities as told by your health care provider. Ask your health care provider what activities are safe for you.  Do not drive or use heavy machinery while taking prescription pain medicine. General instructions   Monitor your pleurisy for any changes.  Take deep breaths often, even if it is painful. This can help prevent lung infection (pneumonia) and collapse of lung tissue (atelectasis).  When lying down, lie on your painful side. This may reduce pain.  Do not smoke. If you need help quitting, ask your health care provider.   Keep all follow-up visits as told by your health care provider. This is important. Contact a health care provider if:  You have pain that: ? Gets worse. ? Does not get better with medicine. ? Lasts for more than 1 week.  You have a fever or chills.  Your cough or shortness of breath is not improving at home.  You cough up pus-like (purulent) secretions. Get help right away if:  Your lips, fingernails, or toenails darken or turn blue.  You cough up blood.  You have any of the following symptoms that get worse: ? Difficulty breathing. ? Shortness of breath. ? Wheezing.  You have pain that spreads into your neck, arms, or jaw.  You develop a rash.  You vomit.  You faint. Summary  Pleurisy is inflammation of the linings of the lungs (pleura).  Pleurisy causes pain that makes it difficult for you to breathe or cough.  Pleurisy is often caused by an underlying infection or disease.  Treatment of pleurisy depends on the cause, and it often includes medicines. This information is not intended to replace advice given to you by your health care provider. Make sure you  discuss any questions you have with your health care provider. Document Released: 02/25/2005 Document Revised: 02/07/2017 Document Reviewed: 11/20/2015 Elsevier Patient Education  2020 [employer].

## 2018-09-24 NOTE — Progress Notes (Signed)
HPI:                                                                Karen Love is a 58 y.o. female [facility]: [facility] today for migraines / left groin pain  Left-sided rib pain for approximately 5 days.  Pain is worse with taking a deep breath, coughing, sneezing.  Rates the pain as mild, intermittent.  She has moderate persistent asthma and currently takes Advair.  She states summer months are especially difficult for her asthma due to grass pollens.  She states she is using her rescue inhaler more frequently several times per day, as many as 5-6 times per day.  Tries to avoid going outside.  She reports she has a history of frequent pneumonia.  Her last infection was in 2018.  She states for some time she was getting pneumonia almost yearly. Denies hx of VTE.   Also reports left lower quadrant abdominal pain.  Onset several years ago, but has been more present the last several months.  Pain is colicky, described as a deep discomfort.  She has not noticed any aggravating or relieving factors.  She admits to constipation and only uses her Linzess 2-3 times per week due to side effects of diarrhea and fecal urgency.  Denies nausea, vomiting, hematochezia, melena. Colonoscopy UTD (04/2015) Also of note, she has a history of intestinal metaplasia of the gastric cardia diagnosed in 2017, treated with Nexium 40 mg, lost to follow-up (due to repeat EGD 2018)  [facility] 09/24/2018 05/11/2018  Decreased Interest 0 0  Down, Depressed, Hopeless 0 0  PHQ - 2 Score 0 0    No flowsheet data found.    Past Medical History:  Diagnosis Date  . Asthma   . Colon polyps   . Diabetes mellitus without complication (HCC)   . History of colon polyps    Past Surgical History:  Procedure Laterality Date  . ABDOMINAL HYSTERECTOMY    . ANKLE FRACTURE SURGERY Right   . CESAREAN SECTION    . PARTIAL HYSTERECTOMY  2000  . TUBAL LIGATION      Social History   Tobacco Use  . Smoking status: Former Smoker    Packs/day: 0.25    Years: 20.00    Pack years: 5.00    Quit date: 05/11/2011    Years since quitting: 7.3  . Smokeless tobacco: Never Used  Substance Use Topics  . Alcohol use: Not Currently   family history is not on file.    ROS: negative except as noted in the HPI  Medications: Current Outpatient Medications  Medication Sig Dispense Refill  . albuterol (PROVENTIL HFA;VENTOLIN HFA) 108 (90 Base) MCG/ACT inhaler Inhale 1 puff into the lungs every 4 (four) hours as needed.    Marland Kitchen. b complex vitamins capsule Take 1 capsule by mouth 3 (three) times daily. 270 capsule 1  . diphenhydrAMINE (BENADRYL) 25 MG tablet Take 1 tablet (25 mg total) by mouth at bedtime as needed (leg cramps). 90 tablet 1  . eletriptan (RELPAX) 40 MG tablet TAKE 1 TAB ONCE AS NEEDED FOR MIGRAINE. MAY REPEAT IN 2 HOURS IF PERSISTS/RECURS-MAX 2 TABS/24 HOURS 6 tablet 0  . Erenumab-aooe (AIMOVIG) 70 MG/ML  SOAJ Inject 70 mg into the skin every 30 (thirty) days. 1 pen 5  . Ferrous Fumarate (FERROCITE) 324 (106 Fe) MG TABS tablet Take 1 tablet (106 mg of iron total) by mouth 2 (two) times daily. Due for follow up visit w/PCP 60 tablet 0  . Fluticasone-Salmeterol (ADVAIR) 500-50 MCG/DOSE AEPB Inhale 1 puff into the lungs 2 (two) times daily.    . meloxicam (MOBIC) 15 MG tablet Take 1 tablet by mouth daily.    Marland Kitchen. Plecanatide (TRULANCE) 3 MG TABS Take 3 mg by mouth daily. 90 tablet 0  . topiramate (TOPAMAX) 100 MG tablet Take 1 tablet (100 mg total) by mouth daily. Due for follow up visit w/PCP 30 tablet 0   No current facility-administered medications for this visit.    Allergies  Allergen Reactions  . Amitriptyline Hcl Other (See Comments)    Weight gain, ineffective for migraines  . Azithromycin Nausea And Vomiting  . Tramadol Nausea And Vomiting            Objective:  BP 119/80   Pulse 72   Temp 98.3 F (36.8 C) (Oral)   Wt 212 lb (96.2  kg)   SpO2 98%   BMI 35.83 kg/m  Gen:  alert, not ill-appearing, no distress, appropriate for age HEENT: head normocephalic without obvious abnormality, conjunctiva and cornea clear, trachea midline Pulm: Normal work of breathing, normal phonation, clear to auscultation bilaterally, poor air movement CV: Normal rate, regular rhythm, s1 and s2 distinct, no murmurs, clicks or rubs  GI: Abdomen soft, tenderness to deep palpation of the left lower quadrant, no guarding or rigidity, no palpable masses, exam limited due to to body habitus, C-section scar noted Neuro: alert and oriented x 3, no tremor MSK: extremities atraumatic, normal gait and station, no peripheral edema Chest wall/Right Ribs: non-tender, no deformity Skin: intact, no rashes on exposed skin, no jaundice, no cyanosis Psych: well-groomed, cooperative, good eye contact, euthymic mood, affect mood-congruent, speech is articulate, and thought processes clear and goal-directed    No results found for this or any previous visit (from the past 72 hour(s)). No results found.    Assessment and Plan: 58 y.o. female with   .Karen Love was seen today for medication management, groin pain and left side pain.  Diagnoses and all orders for this visit:  Rib pain on left side -     DG Ribs Unilateral W/Chest Left  Intractable migraine with aura without status migrainosus -     Erenumab-aooe (AIMOVIG) 70 MG/ML SOAJ; Inject 70 mg into the skin every 30 (thirty) days.  Pleuritic chest pain -     DG Ribs Unilateral W/Chest Left  Moderate persistent asthma without complication -     Ambulatory referral to Pulmonology -     montelukast (SINGULAIR) 10 MG tablet; Take 1 tablet (10 mg total) by mouth at bedtime.  Colicky LLQ abdominal pain -     Lipase -     CBC with Differential/Platelet -     COMPLETE METABOLIC PANEL WITH GFR -     DG Abd 2 Views -     linaclotide (LINZESS) 145 MCG CAPS capsule; Take 1 capsule (145 mcg total) by mouth  daily before breakfast.  Chronic idiopathic constipation -     linaclotide (LINZESS) 145 MCG CAPS capsule; Take 1 capsule (145 mcg total) by mouth daily before breakfast.  Intestinal metaplasia of gastric cardia -     Ambulatory referral to Gastroenterology  Benign colon polyp  Diverticulosis  Internal hemorrhoids   LLQ pain CBC, CMP, lipase, and plain film of the abdomen pending Personally reviewed prior EGD/Colonoscopy from 2017 Referral placed to GI for surveillance of intestinal metaplasia Colicky LLQ pain is likely due to constipation. Diverticulitis is less likely with benign abdominal exam but on the differential   Left rib pain Will obtain CXR w/ribs to r/o pneumonia. No clinical signs or symptoms of pneumonia apart from pleuritic left-sided pain. Afebrile, no tachypnea, no tachycardia, pulse ox 98% on RA at rest, no adventitious lung sounds. However, asthma is poorly controlled with increased use of rescue inhaler and she may have some pleuritis She did not wish to switch Advair inhaler Start Singulair Referral to Pulmonology for PFT's and further management of asthma   Patient education and anticipatory guidance given Patient agrees with treatment plan Follow-up in 2 weeks or sooner as needed if symptoms worsen or fail to improve  Darlyne Russian PA-C

## 2018-09-25 ENCOUNTER — Telehealth: Payer: Self-pay | Admitting: Physician Assistant

## 2018-09-25 LAB — CBC WITH DIFFERENTIAL/PLATELET
Absolute Monocytes: 358 cells/uL (ref 200–950)
Basophils Absolute: 22 cells/uL (ref 0–200)
Basophils Relative: 0.4 %
Eosinophils Absolute: 129 cells/uL (ref 15–500)
Eosinophils Relative: 2.3 %
HCT: 36.4 % (ref 35.0–45.0)
Hemoglobin: 11.1 g/dL — ABNORMAL LOW (ref 11.7–15.5)
Lymphs Abs: 2391 cells/uL (ref 850–3900)
MCH: 22.1 pg — ABNORMAL LOW (ref 27.0–33.0)
MCHC: 30.5 g/dL — ABNORMAL LOW (ref 32.0–36.0)
MCV: 72.4 fL — ABNORMAL LOW (ref 80.0–100.0)
MPV: 11.8 fL (ref 7.5–12.5)
Monocytes Relative: 6.4 %
Neutro Abs: 2699 cells/uL (ref 1500–7800)
Neutrophils Relative %: 48.2 %
Platelets: 273 10*3/uL (ref 140–400)
RBC: 5.03 10*6/uL (ref 3.80–5.10)
RDW: 15.9 % — ABNORMAL HIGH (ref 11.0–15.0)
Total Lymphocyte: 42.7 %
WBC: 5.6 10*3/uL (ref 3.8–10.8)

## 2018-09-25 LAB — LIPASE: Lipase: 36 U/L (ref 7–60)

## 2018-09-25 LAB — COMPLETE METABOLIC PANEL WITH GFR
AG Ratio: 1.4 (calc) (ref 1.0–2.5)
ALT: 13 U/L (ref 6–29)
AST: 19 U/L (ref 10–35)
Albumin: 4.2 g/dL (ref 3.6–5.1)
Alkaline phosphatase (APISO): 62 U/L (ref 37–153)
BUN: 10 mg/dL (ref 7–25)
CO2: 22 mmol/L (ref 20–32)
Calcium: 9.6 mg/dL (ref 8.6–10.4)
Chloride: 108 mmol/L (ref 98–110)
Creat: 0.84 mg/dL (ref 0.50–1.05)
GFR, Est African American: 90 mL/min/{1.73_m2} (ref >=60)
GFR, Est Non African American: 78 mL/min/{1.73_m2} (ref >=60)
Globulin: 3.1 g/dL (calc) (ref 1.9–3.7)
Glucose, Bld: 101 mg/dL — ABNORMAL HIGH (ref 65–99)
Potassium: 3.8 mmol/L (ref 3.5–5.3)
Sodium: 139 mmol/L (ref 135–146)
Total Bilirubin: 0.3 mg/dL (ref 0.2–1.2)
Total Protein: 7.3 g/dL (ref 6.1–8.1)

## 2018-09-25 NOTE — Telephone Encounter (Signed)
Karen Love wants the referral that was sent out yesterday to be a placed in [facility] instead of [place].   09/24/18 Pulmonary Disease

## 2018-09-27 ENCOUNTER — Other Ambulatory Visit: Payer: Self-pay | Admitting: Physician Assistant

## 2018-09-27 DIAGNOSIS — K5904 Chronic idiopathic constipation: Secondary | ICD-10-CM

## 2018-09-28 ENCOUNTER — Other Ambulatory Visit: Payer: Self-pay | Admitting: Physician Assistant

## 2018-09-28 DIAGNOSIS — J4520 Mild intermittent asthma, uncomplicated: Secondary | ICD-10-CM

## 2018-09-30 ENCOUNTER — Other Ambulatory Visit: Payer: Self-pay | Admitting: Physician Assistant

## 2018-09-30 DIAGNOSIS — R252 Cramp and spasm: Secondary | ICD-10-CM

## 2018-09-30 NOTE — Telephone Encounter (Signed)
Which referral? She had 2 on this day GI and Pulmonary

## 2018-10-07 NOTE — Telephone Encounter (Signed)
Patient didn't specify which one it was. Can you call to verify.

## 2018-10-08 ENCOUNTER — Ambulatory Visit: Payer: BC Managed Care – PPO | Admitting: Physician Assistant

## 2018-10-19 NOTE — Telephone Encounter (Signed)
Switching both - CF

## 2018-10-20 ENCOUNTER — Other Ambulatory Visit: Payer: Self-pay | Admitting: Physician Assistant

## 2018-10-20 DIAGNOSIS — D509 Iron deficiency anemia, unspecified: Secondary | ICD-10-CM

## 2018-10-22 NOTE — Telephone Encounter (Signed)
Received a fax from [place] that [place] was approved through 09/27/2019.

## 2018-10-26 ENCOUNTER — Telehealth: Payer: Self-pay | Admitting: Physician Assistant

## 2018-10-26 ENCOUNTER — Other Ambulatory Visit: Payer: Self-pay | Admitting: Physician Assistant

## 2018-10-26 DIAGNOSIS — J4520 Mild intermittent asthma, uncomplicated: Secondary | ICD-10-CM

## 2018-10-26 NOTE — Telephone Encounter (Signed)
Approved today (Albuterol)  Effective from 10/26/2018 through 10/25/2019. Pharmacy aware.

## 2018-11-17 ENCOUNTER — Other Ambulatory Visit: Payer: Self-pay | Admitting: Physician Assistant

## 2018-11-17 DIAGNOSIS — D509 Iron deficiency anemia, unspecified: Secondary | ICD-10-CM

## 2018-11-17 DIAGNOSIS — G43119 Migraine with aura, intractable, without status migrainosus: Secondary | ICD-10-CM

## 2018-11-17 DIAGNOSIS — J4520 Mild intermittent asthma, uncomplicated: Secondary | ICD-10-CM

## 2018-12-14 ENCOUNTER — Other Ambulatory Visit: Payer: Self-pay | Admitting: Physician Assistant

## 2018-12-14 DIAGNOSIS — G43119 Migraine with aura, intractable, without status migrainosus: Secondary | ICD-10-CM

## 2018-12-14 DIAGNOSIS — D509 Iron deficiency anemia, unspecified: Secondary | ICD-10-CM

## 2018-12-14 DIAGNOSIS — J4520 Mild intermittent asthma, uncomplicated: Secondary | ICD-10-CM

## 2018-12-17 ENCOUNTER — Telehealth: Payer: Self-pay | Admitting: Family Medicine

## 2018-12-17 DIAGNOSIS — D509 Iron deficiency anemia, unspecified: Secondary | ICD-10-CM

## 2018-12-17 DIAGNOSIS — G43119 Migraine with aura, intractable, without status migrainosus: Secondary | ICD-10-CM

## 2018-12-18 NOTE — Telephone Encounter (Signed)
Called patient and she states she no longer lives in [place] and we are no longer her PCP anymore. Have removed charley's name as PCP. No further questions at this time.

## 2018-12-18 NOTE — Telephone Encounter (Signed)
Mishael needs a follow up appointment.

## 2019-01-12 DIAGNOSIS — R05 Cough: Secondary | ICD-10-CM | POA: Diagnosis not present

## 2019-01-12 DIAGNOSIS — H6503 Acute serous otitis media, bilateral: Secondary | ICD-10-CM | POA: Diagnosis not present

## 2019-01-12 DIAGNOSIS — J45909 Unspecified asthma, uncomplicated: Secondary | ICD-10-CM | POA: Diagnosis not present

## 2019-01-12 DIAGNOSIS — R0981 Nasal congestion: Secondary | ICD-10-CM | POA: Diagnosis not present

## 2019-01-19 DIAGNOSIS — J45909 Unspecified asthma, uncomplicated: Secondary | ICD-10-CM | POA: Diagnosis not present

## 2019-01-19 DIAGNOSIS — J206 Acute bronchitis due to rhinovirus: Secondary | ICD-10-CM | POA: Diagnosis not present

## 2019-01-19 DIAGNOSIS — R05 Cough: Secondary | ICD-10-CM | POA: Diagnosis not present

## 2019-01-26 DIAGNOSIS — Z1231 Encounter for screening mammogram for malignant neoplasm of breast: Secondary | ICD-10-CM | POA: Diagnosis not present

## 2019-03-09 DIAGNOSIS — Z86010 Personal history of colonic polyps: Secondary | ICD-10-CM | POA: Diagnosis not present

## 2019-03-09 DIAGNOSIS — Z1211 Encounter for screening for malignant neoplasm of colon: Secondary | ICD-10-CM | POA: Diagnosis not present

## 2019-03-22 DIAGNOSIS — D122 Benign neoplasm of ascending colon: Secondary | ICD-10-CM | POA: Diagnosis not present

## 2019-03-22 DIAGNOSIS — K5730 Diverticulosis of large intestine without perforation or abscess without bleeding: Secondary | ICD-10-CM | POA: Diagnosis not present

## 2019-03-22 DIAGNOSIS — Z86010 Personal history of colonic polyps: Secondary | ICD-10-CM | POA: Diagnosis not present

## 2019-03-22 DIAGNOSIS — K648 Other hemorrhoids: Secondary | ICD-10-CM | POA: Diagnosis not present

## 2019-03-22 DIAGNOSIS — Z1211 Encounter for screening for malignant neoplasm of colon: Secondary | ICD-10-CM | POA: Diagnosis not present

## 2019-03-22 DIAGNOSIS — K621 Rectal polyp: Secondary | ICD-10-CM | POA: Diagnosis not present

## 2019-03-26 DIAGNOSIS — J019 Acute sinusitis, unspecified: Secondary | ICD-10-CM | POA: Diagnosis not present

## 2019-03-26 DIAGNOSIS — R05 Cough: Secondary | ICD-10-CM | POA: Diagnosis not present

## 2019-03-26 DIAGNOSIS — R52 Pain, unspecified: Secondary | ICD-10-CM | POA: Diagnosis not present

## 2019-03-26 DIAGNOSIS — R0981 Nasal congestion: Secondary | ICD-10-CM | POA: Diagnosis not present

## 2019-03-27 DIAGNOSIS — Z20828 Contact with and (suspected) exposure to other viral communicable diseases: Secondary | ICD-10-CM | POA: Diagnosis not present

## 2020-10-13 IMAGING — DX ABDOMEN - 2 VIEW
3 series · 3 of 3 positions shown · non-contrast
Comparison: None.

CLINICAL DATA: Lower abdominal pain

EXAM:
ABDOMEN - 2 VIEW

[abdomen erect]
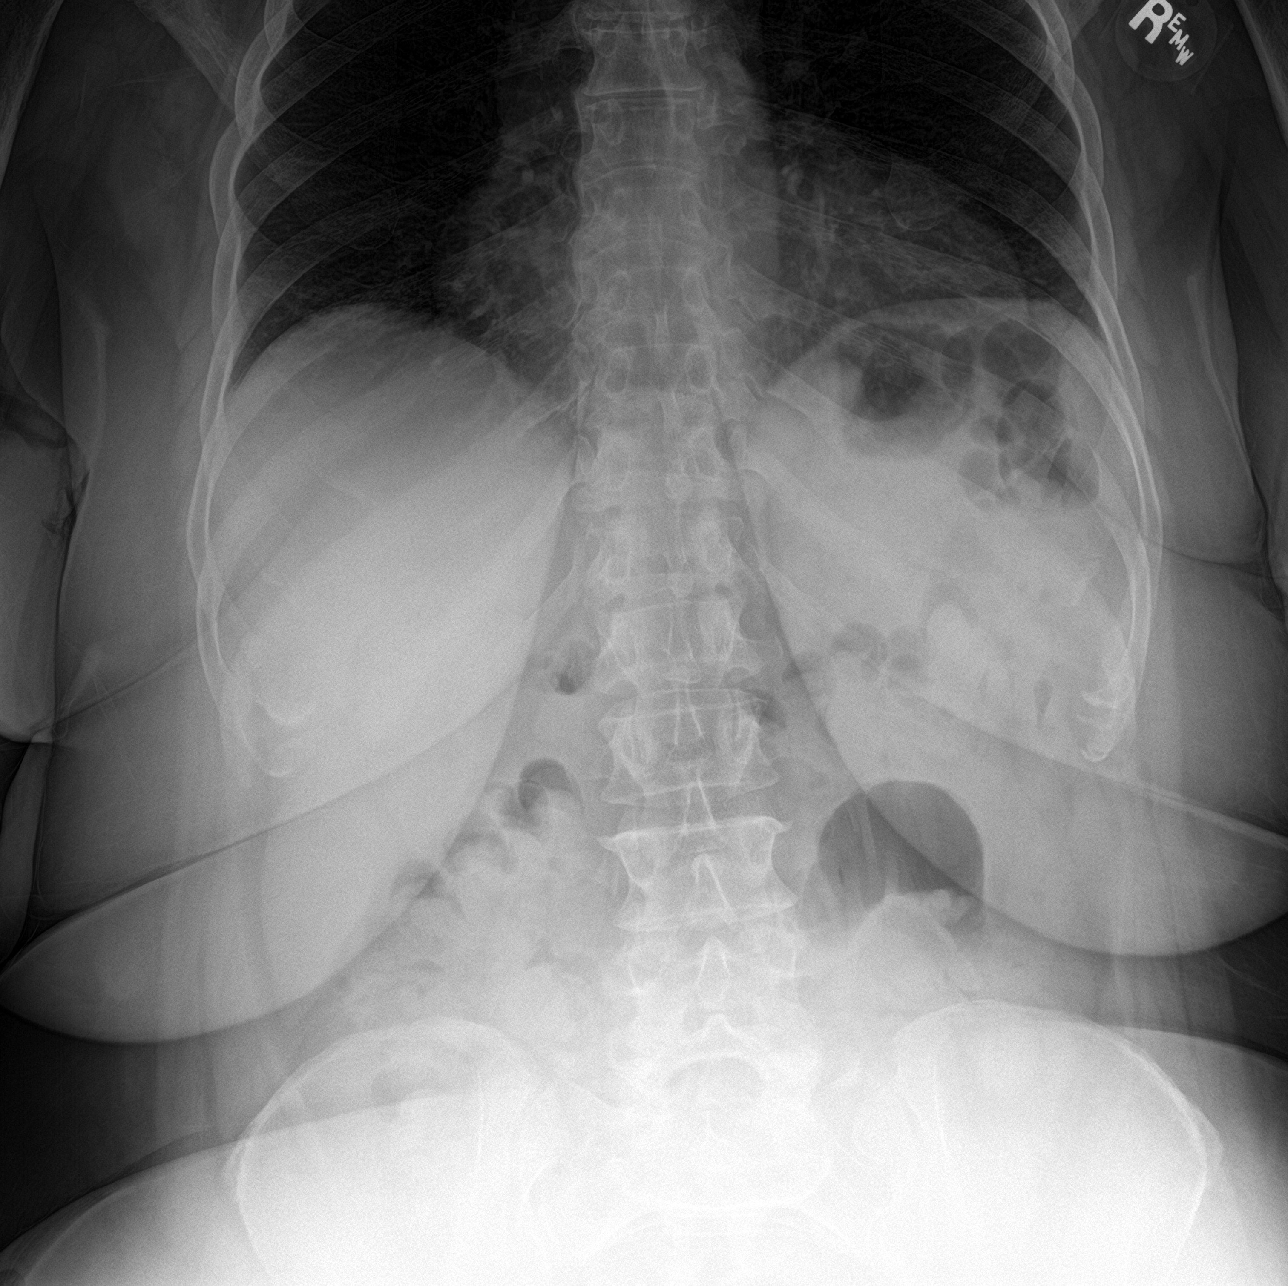

[abdomen supine (1 of 2)]
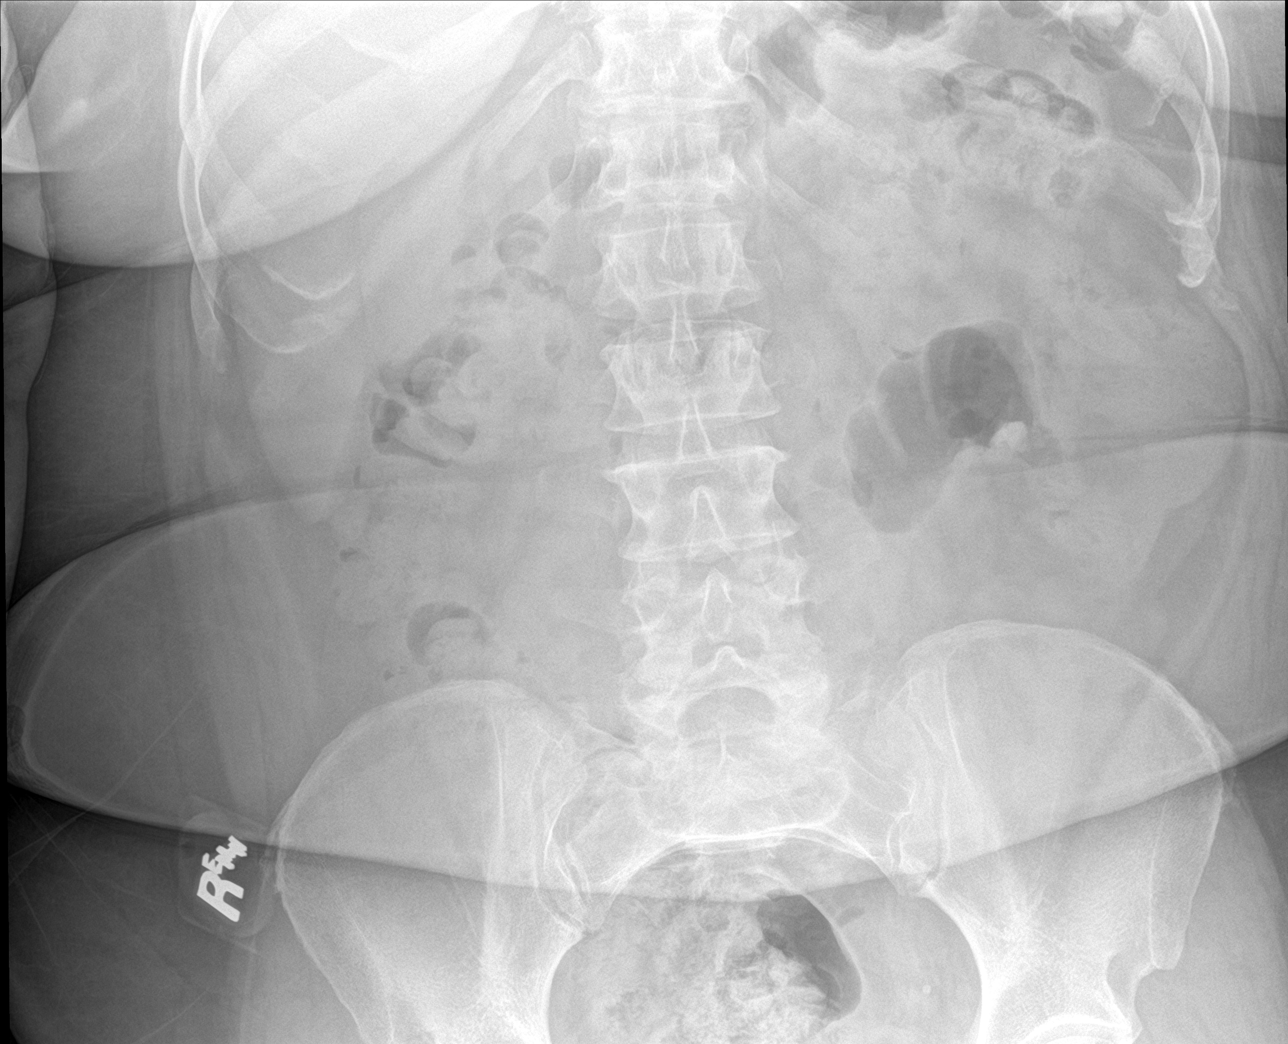

[abdomen supine (2 of 2)]
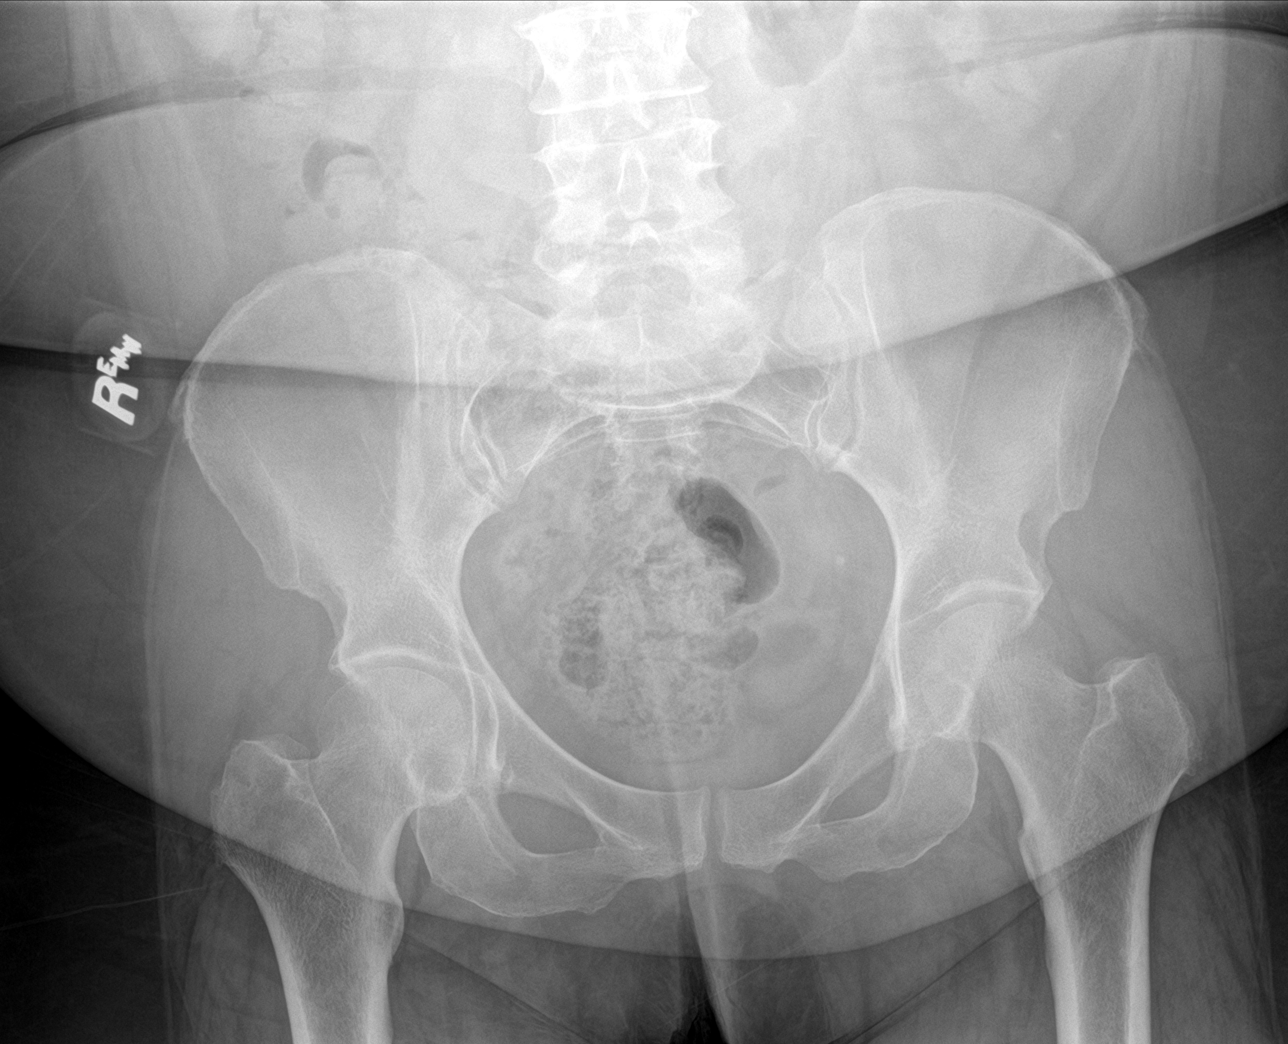

[3 of 3 positions shown; findings below may reference images not displayed]

FINDINGS: Supine and upright images obtained. There is moderate stool
throughout colon. There is no bowel dilatation or air-fluid level to
suggest bowel obstruction. No free air. There is a small apparent
phlebolith in the lateral left pelvis. Lung bases are clear.
IMPRESSION: Moderate stool in colon.  No evident bowel obstruction or free air.

## 2020-10-13 IMAGING — DX LEFT RIBS AND CHEST - 3+ VIEW
3 series · 3 of 3 positions shown · non-contrast
Comparison: None.

CLINICAL DATA: Chest pain

EXAM:
LEFT RIBS AND CHEST - 3+ VIEW

[chest pa]
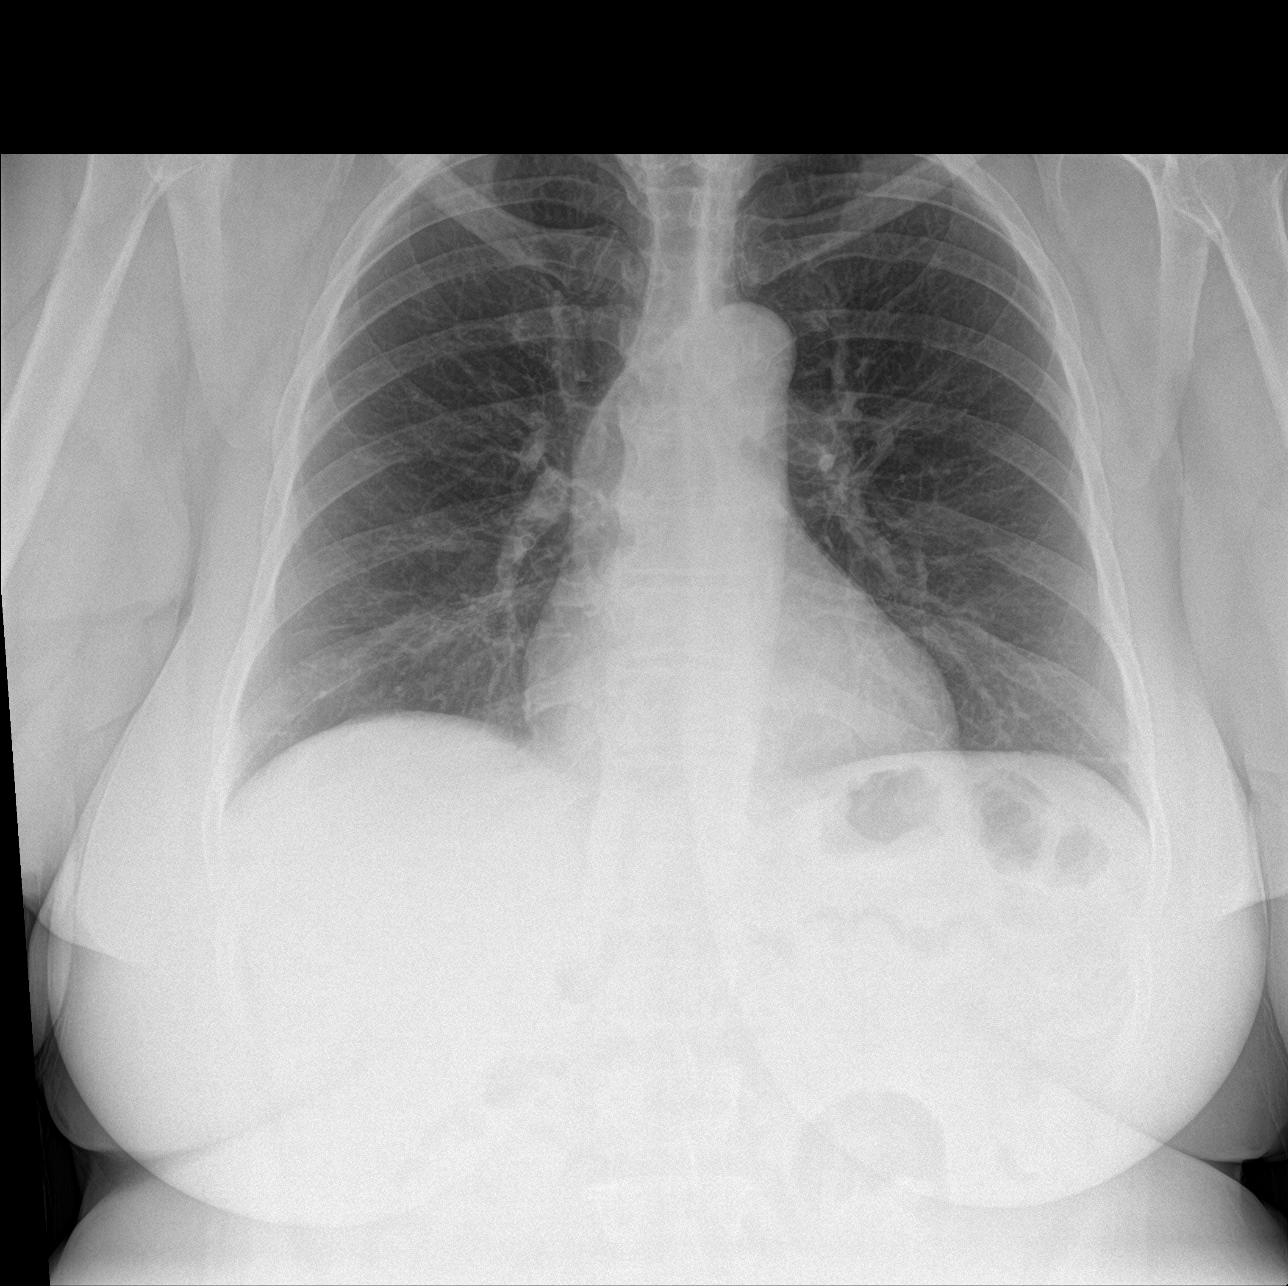

[rib pa]
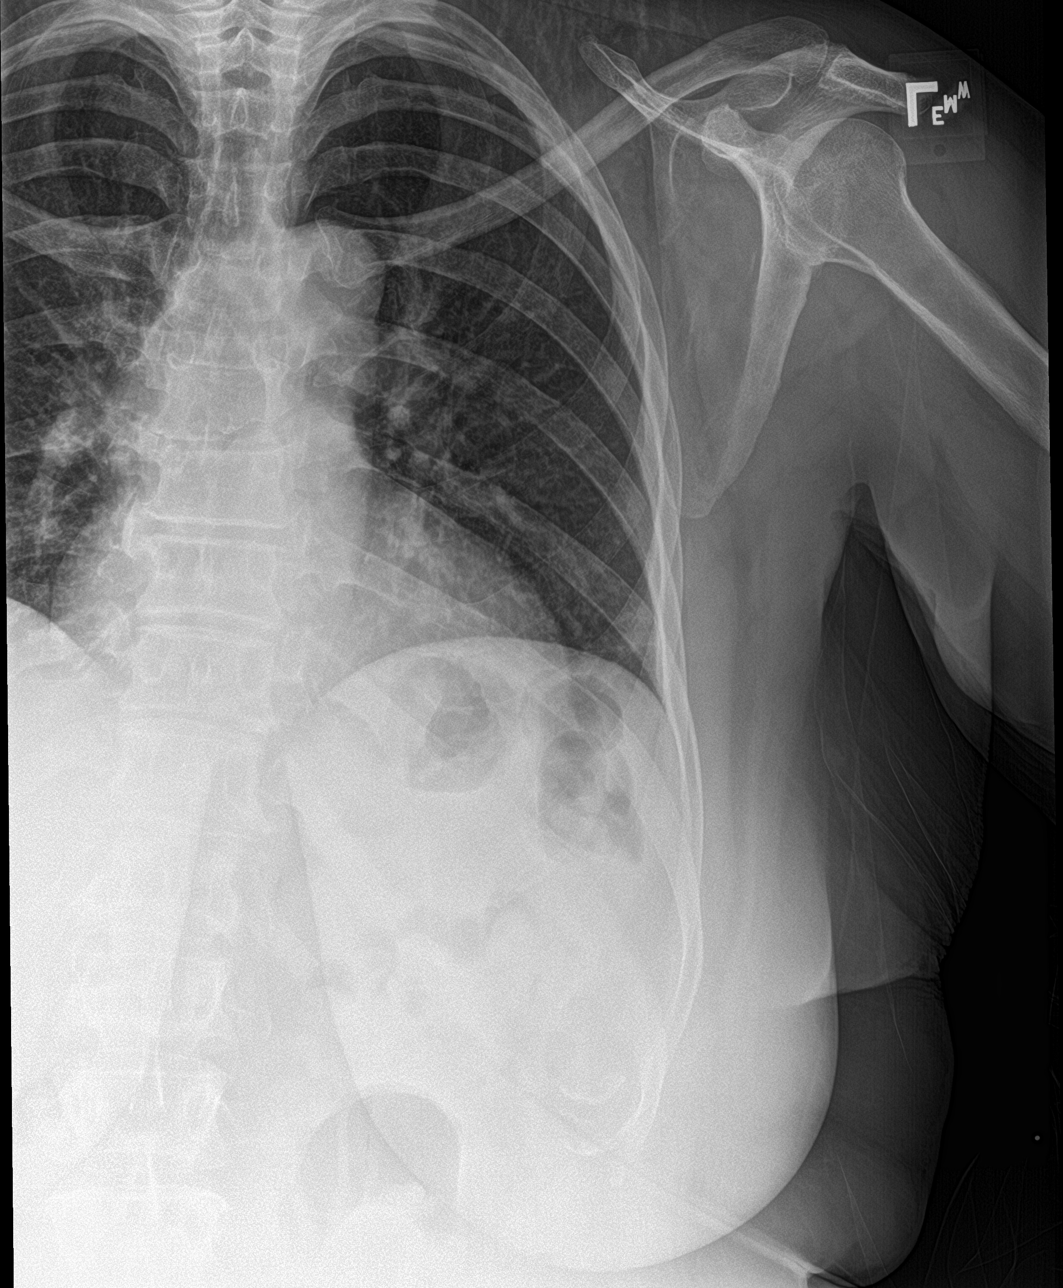

[rib pa obl]
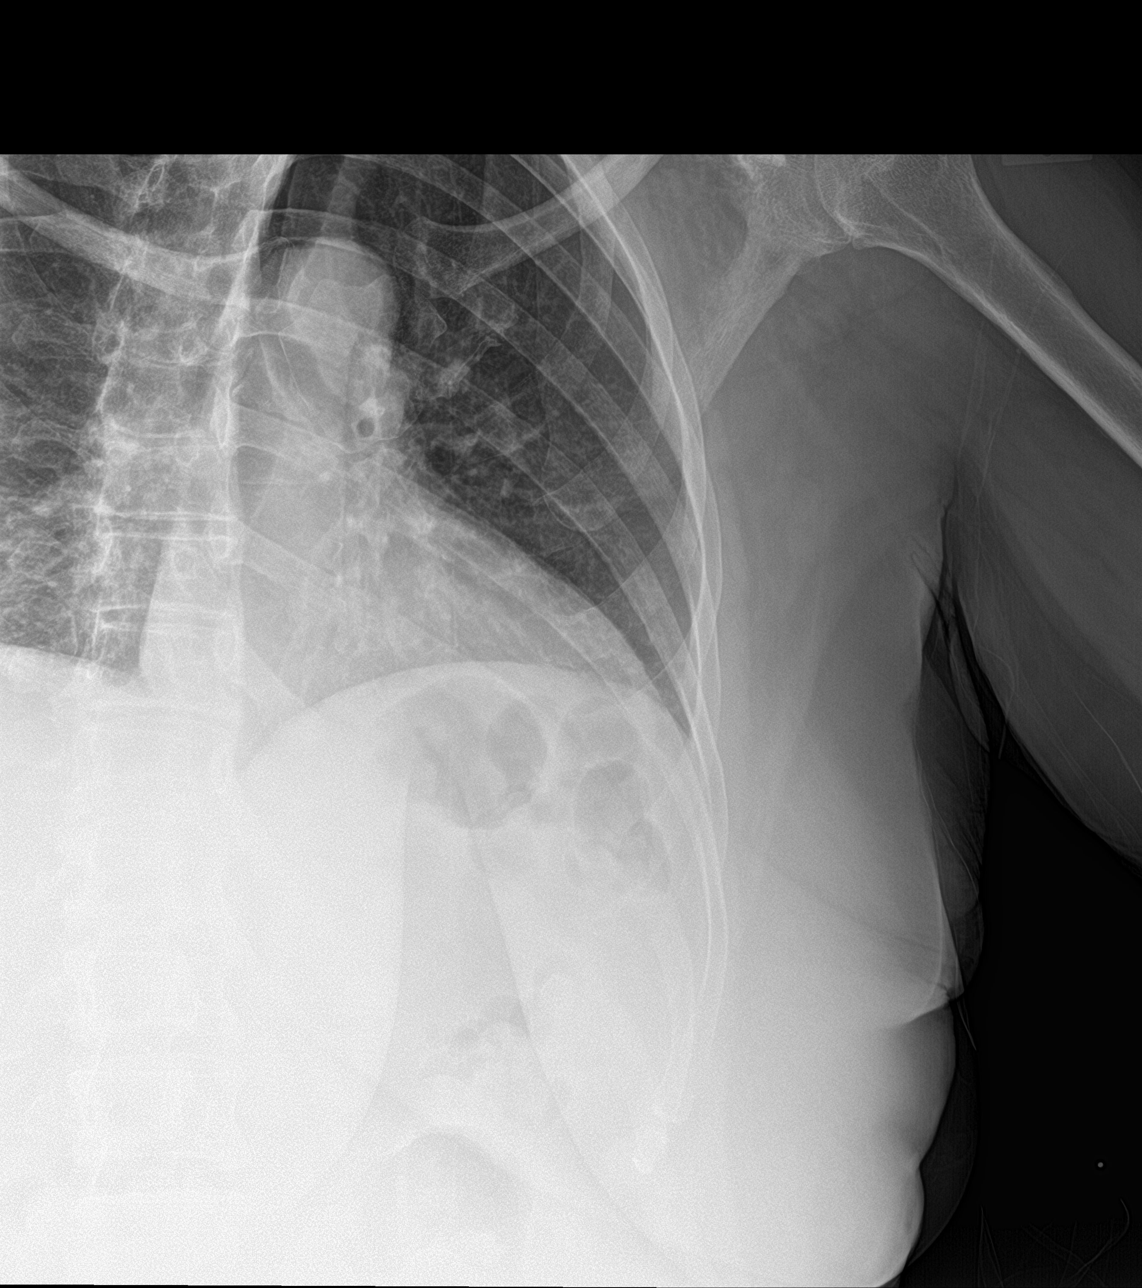

[3 of 3 positions shown; findings below may reference images not displayed]

FINDINGS: Frontal chest as well as oblique and cone-down rib images obtained.
There is minimal atelectasis in the left base. The lungs elsewhere
are clear. The heart size and pulmonary vascularity are normal. No
adenopathy.

No demonstrable rib fracture.  No pleural effusion or pneumothorax.
IMPRESSION: Minimal left base atelectasis. No edema or consolidation. No evident
rib fracture. No pneumothorax.
